# Patient Record
Sex: Female | Born: 1940 | Race: White | Hispanic: No
Health system: Southern US, Community
[De-identification: ages and names within clinical notes are randomized; demographics above are authoritative.]

---

## 2005-06-05 ENCOUNTER — Emergency Department: Payer: Self-pay | Admitting: Emergency Medicine

## 2005-06-05 ENCOUNTER — Other Ambulatory Visit: Payer: Self-pay

## 2007-03-11 ENCOUNTER — Ambulatory Visit: Payer: Self-pay | Admitting: Family Medicine

## 2007-04-09 IMAGING — CT CT CHEST W/ CM
2 series · 16 of 31 positions shown, 19 images · IV contrast (APPLIED)
Comparison: none

REASON FOR EXAM: Chest pain
COMMENTS:

[Series 4: soft tissue · axial · 0.62mm/px · z∈[-318,-152]mm · 7 of 87 slices shown]
[im 11/87  mediastinal]
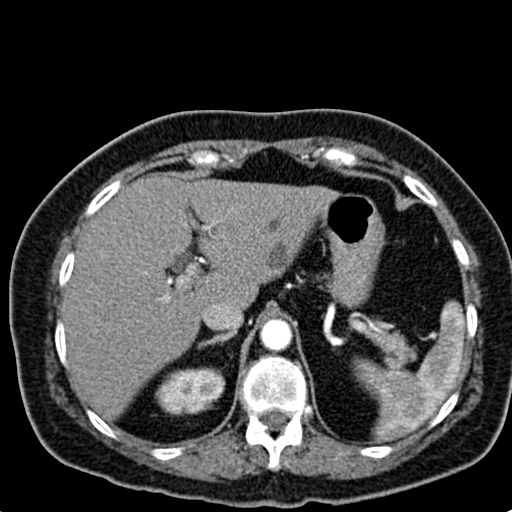
[im 21/87  mediastinal]
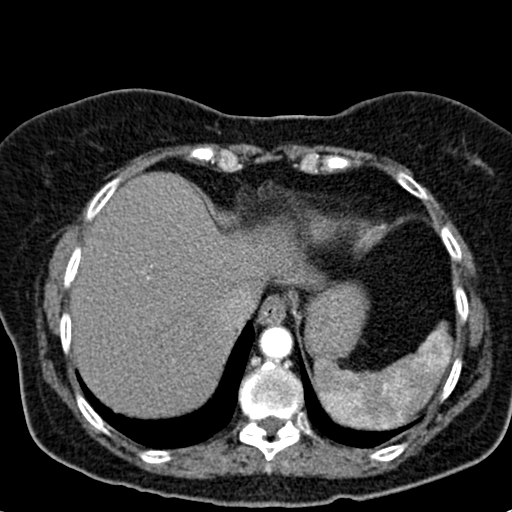
[im 29/87  mediastinal]
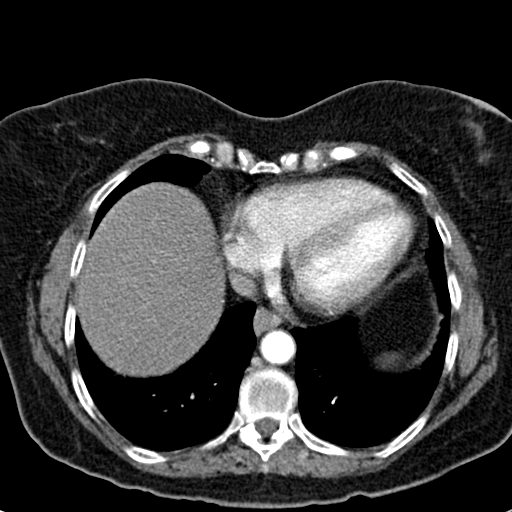
[im 36/87  mediastinal]
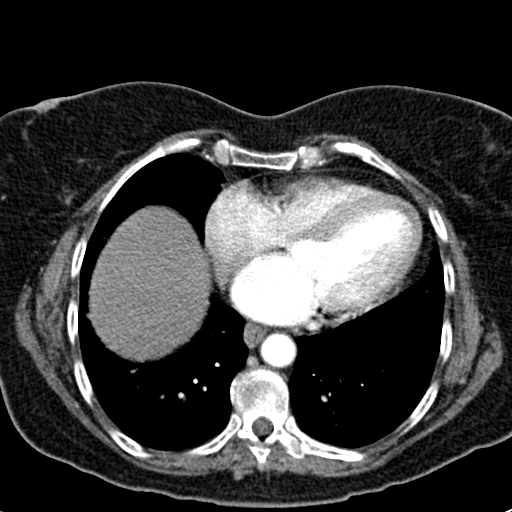
[im 51/87  mediastinal]
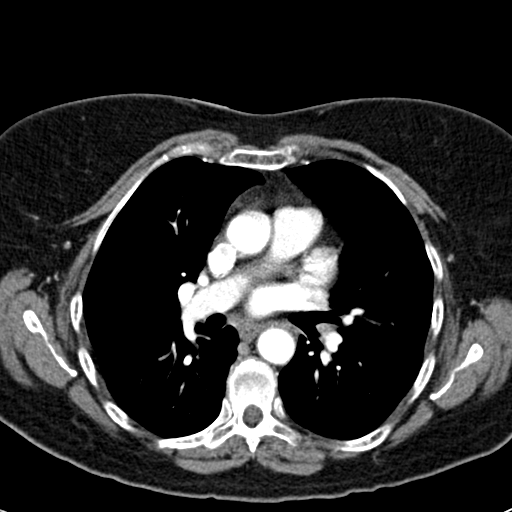
[im 58/87  mediastinal]
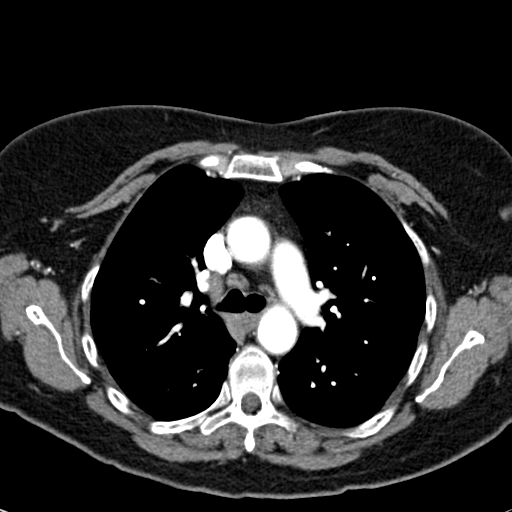
[im 66/87  mediastinal]
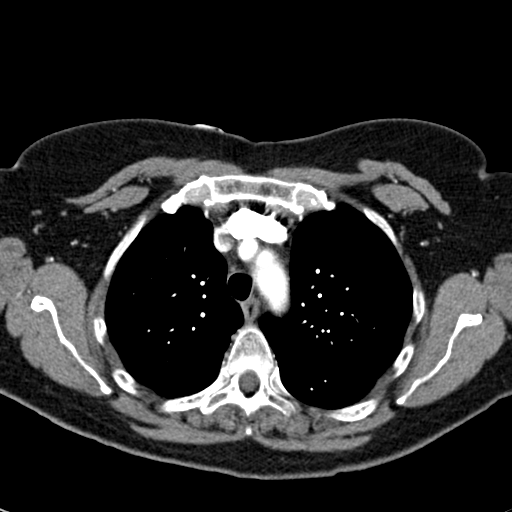

[Series 5: lung windows · axial · 0.62mm/px · z∈[-320,-120]mm · 9 of 52 slices shown, 12 images]
[im 6/52  mediastinal]
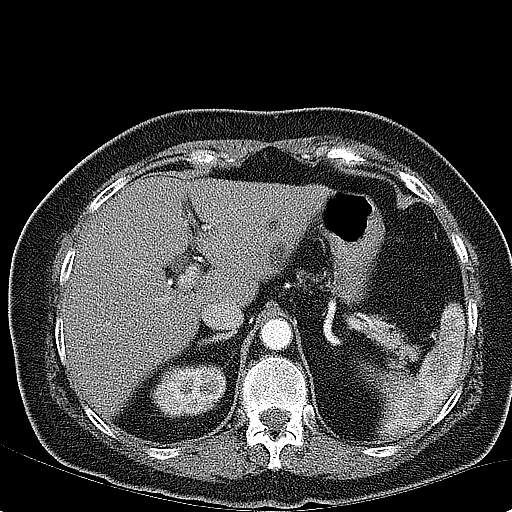
[im 6/52  lung]
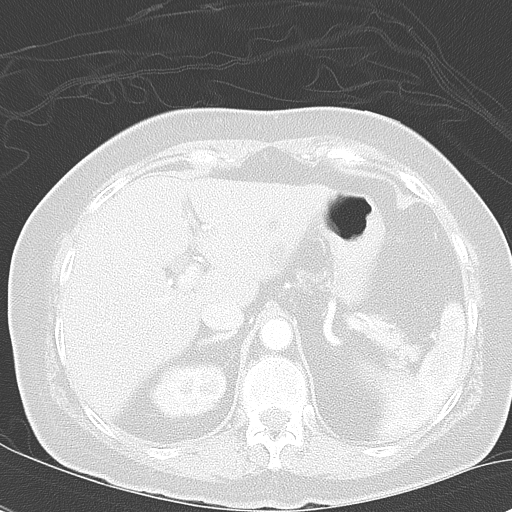
[im 12/52  lung]
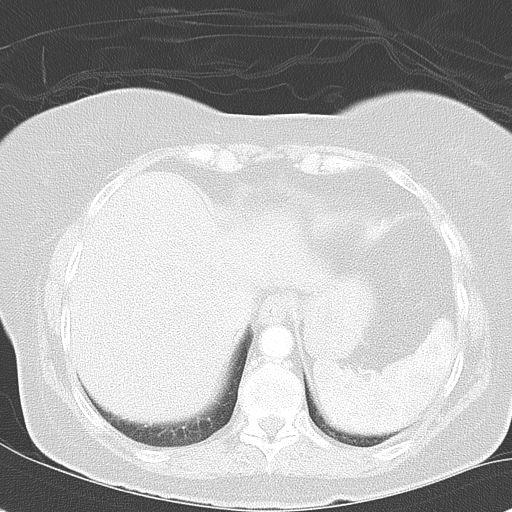
[im 18/52  lung]
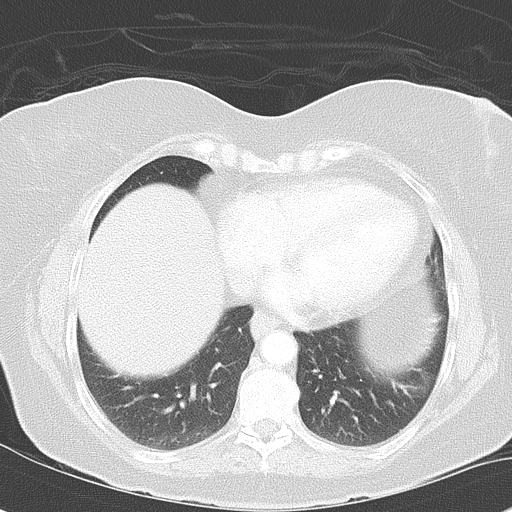
[im 23/52  lung]
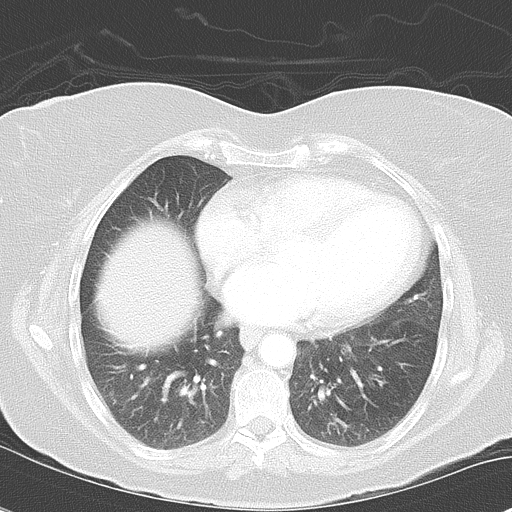
[im 25/52  mediastinal]
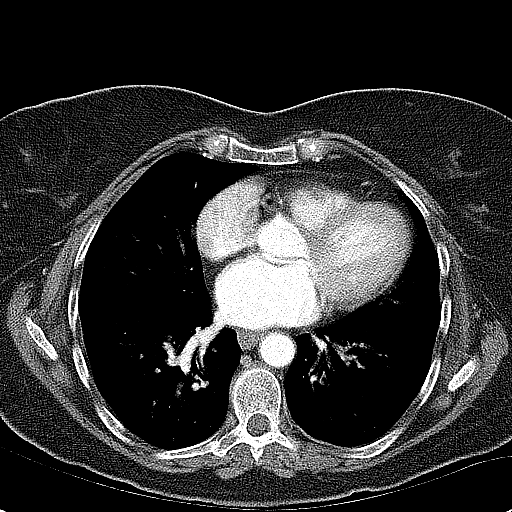
[im 25/52  lung]
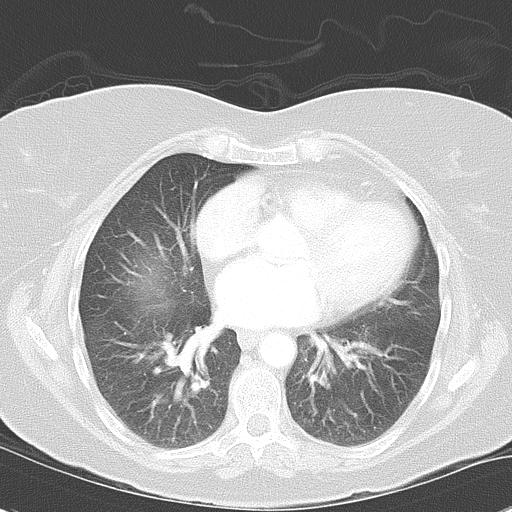
[im 29/52  lung]
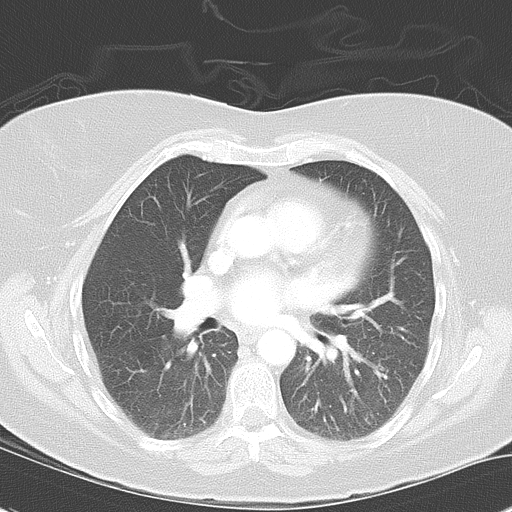
[im 35/52  lung]
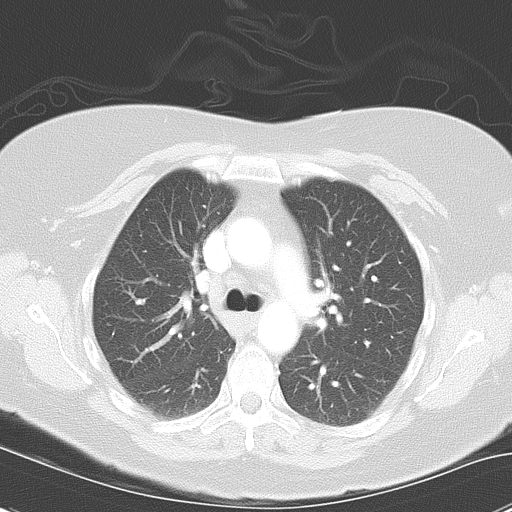
[im 40/52  lung]
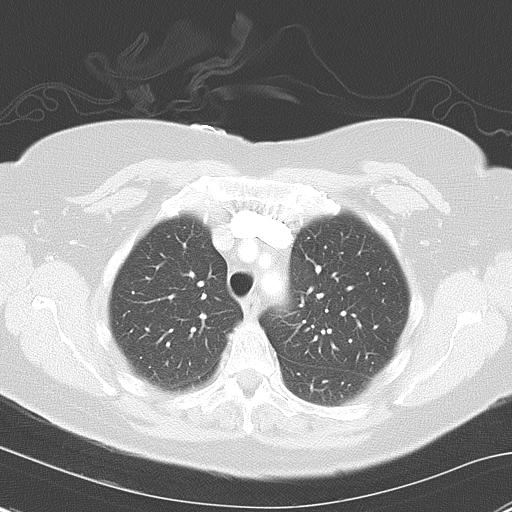
[im 46/52  mediastinal]
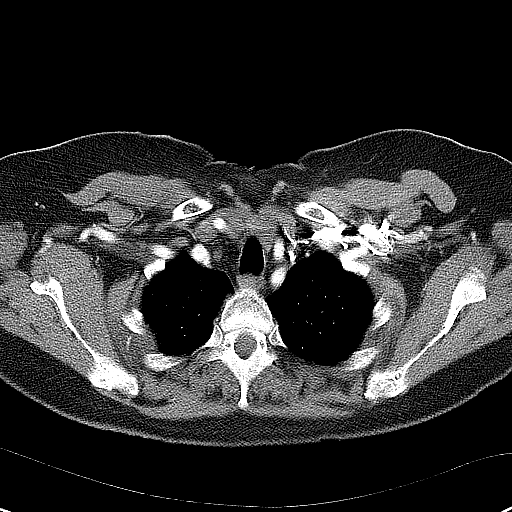
[im 46/52  lung]
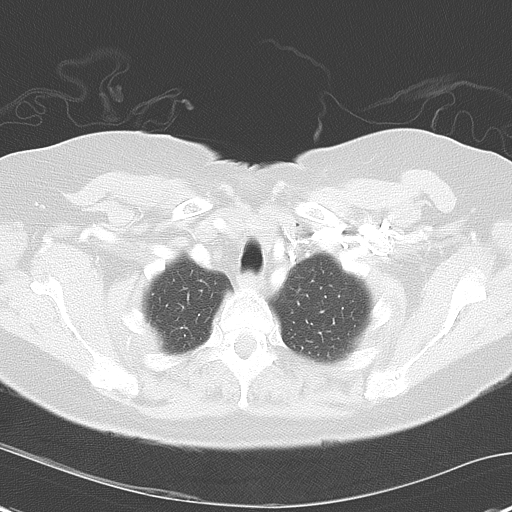

[16 of 31 positions shown; findings below may reference images not displayed]

PROCEDURE:     CT  - CT CHEST (FOR PE) W  - June 05, 2005  [DATE]

RESULT:          Spiral 3 mm sections were obtained from the thoracic inlet
through the lung bases status post intravenous administration of 75 ml of
Fsovue-VAN.

Evaluation of the mediastinum and hilar regions and structures demonstrates
no evidence of mediastinal nor hilar adenopathy nor masses.

There is no evidence of filling defects within the main lobar segmental
pulmonary arteries to suggest sequela of pulmonary embolus.

Evaluation of the lung parenchyma demonstrates no evidence of focal
infiltrates, effusions or edema.

The visualized upper abdominal viscera demonstrate no gross abnormalities.
The patient is status post cholecystectomy.

Incidental note is made of a low attenuating area projecting within the LEFT
lobe of the liver.  This may represent a liver cyst, though if there is a
history of neoplastic disease or abnormal liver function evaluation, this
area can be further evaluated with a triphasic CT or liver MRI for more
ominous etiologies.
IMPRESSION: 1.     No evidence of a pulmonary embolus.
2.     Dr. Mihkel-Matteus, of the emergency department, was informed of the
pulmonary embolus findings at the time of the initial interpretation.
3.     An area of low attenuation within the LEFT lobe of the liver.  This
area may represent sequela of a cyst.  This can be monitored with triphasic
CT and/or further evaluated with MRI.  Correlation with liver function tests
is recommended if clinically warranted.

## 2008-03-11 ENCOUNTER — Ambulatory Visit: Payer: Self-pay | Admitting: Family Medicine

## 2008-03-21 ENCOUNTER — Emergency Department: Payer: Self-pay | Admitting: Emergency Medicine

## 2008-12-16 ENCOUNTER — Ambulatory Visit: Payer: Self-pay | Admitting: Internal Medicine

## 2009-03-14 ENCOUNTER — Ambulatory Visit: Payer: Self-pay | Admitting: Family Medicine

## 2010-07-04 ENCOUNTER — Ambulatory Visit: Payer: Self-pay | Admitting: Family Medicine

## 2010-10-20 IMAGING — CR DG ABDOMEN 2V
1 series · 4 of 4 positions shown · non-contrast
Comparison: none

REASON FOR EXAM: abdominal pain
COMMENTS:

[Series 1: view not recorded · 0.17mm/px · 4 of 4 slices shown]
[im 1/4]
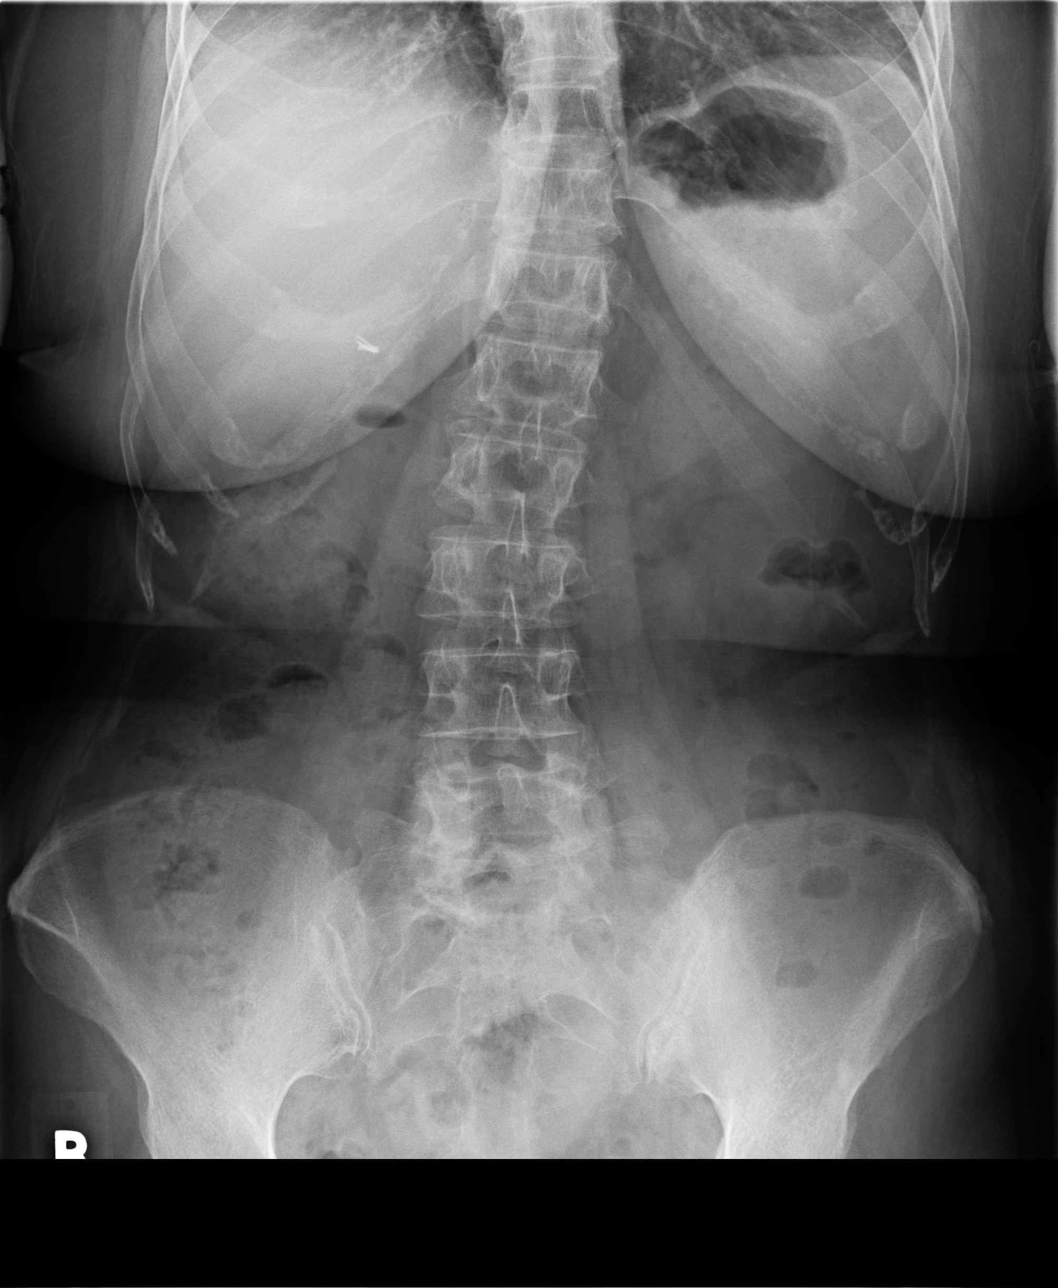
[im 2/4]
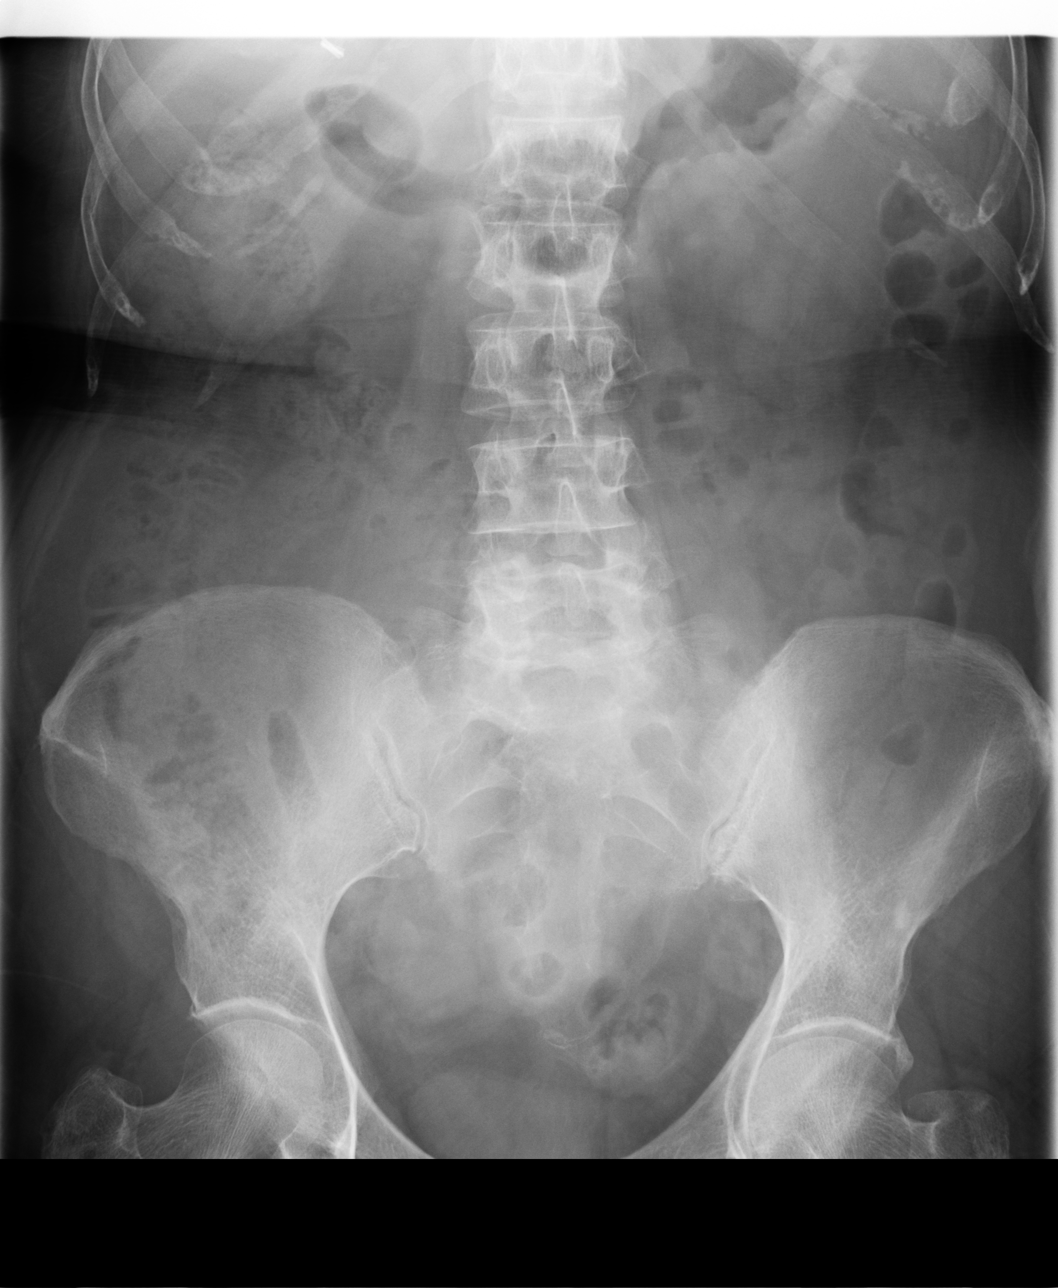
[im 3/4]
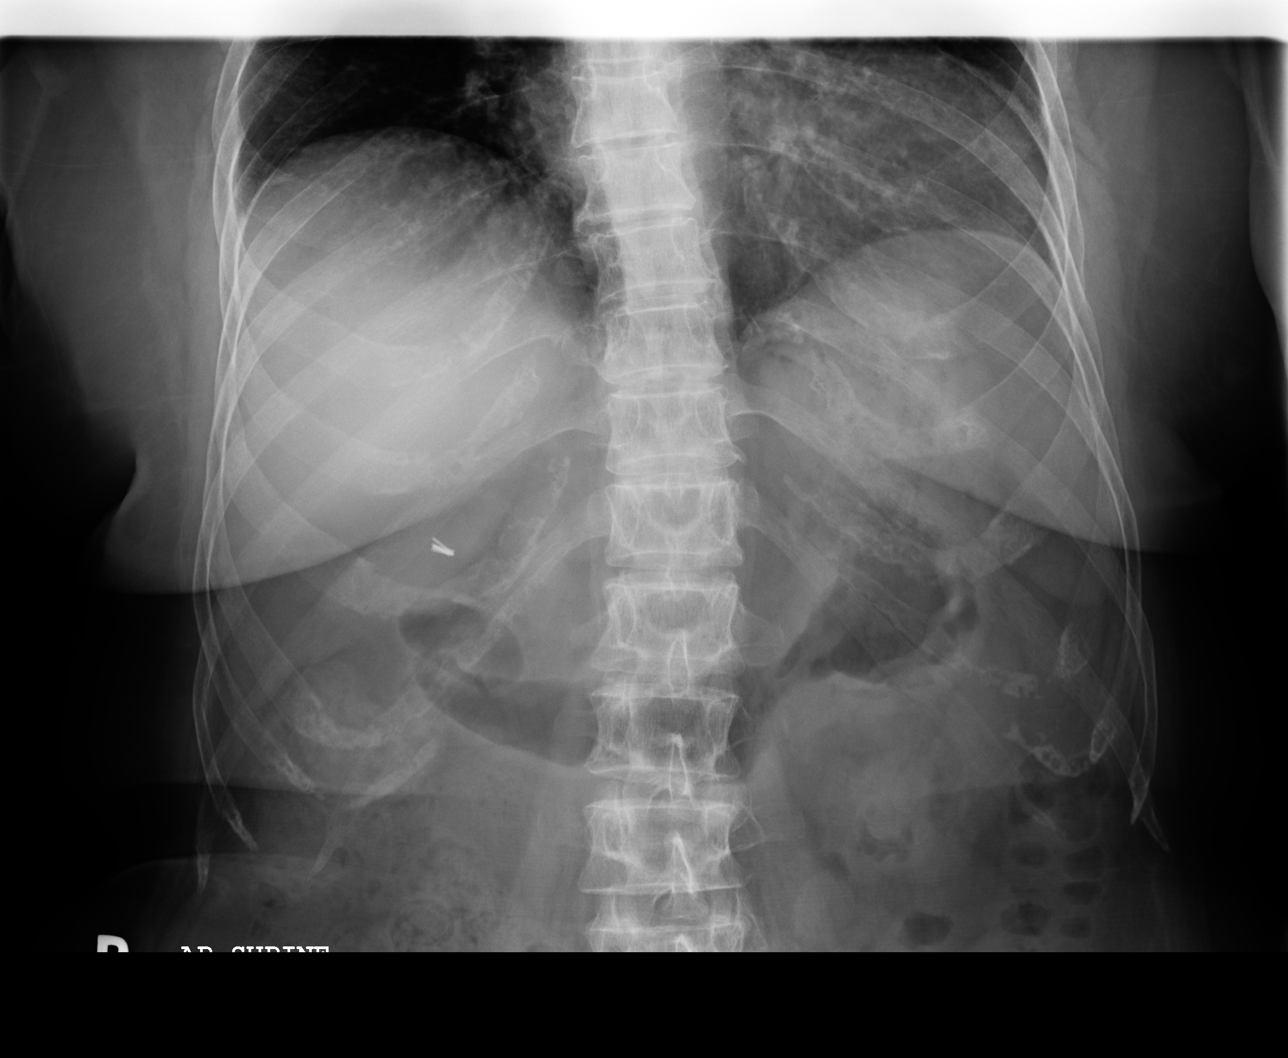
[im 4/4]
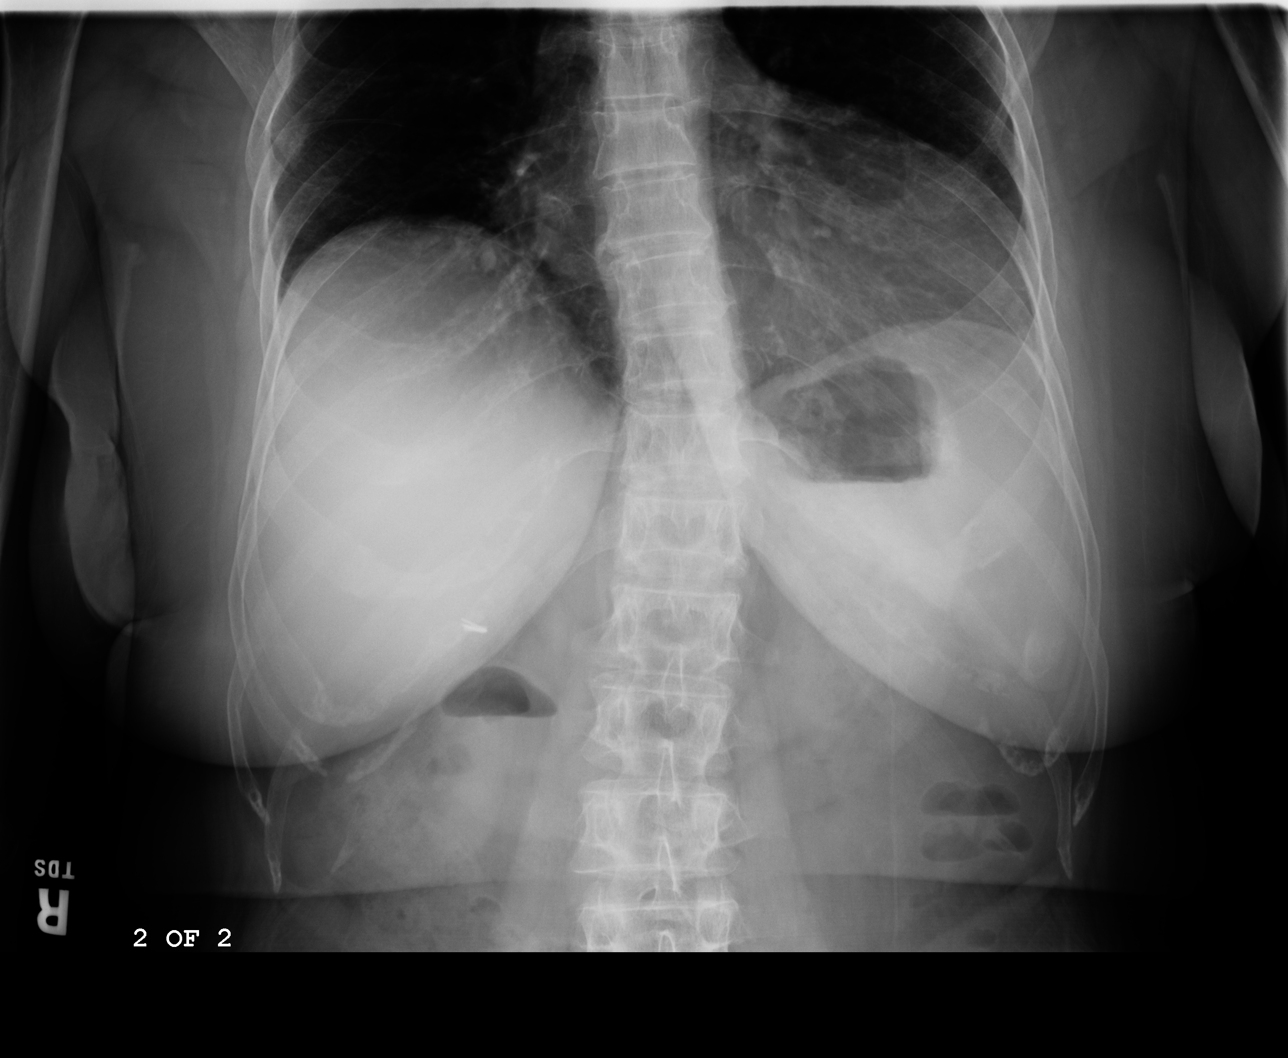

[4 of 4 positions shown; findings below may reference images not displayed]

PROCEDURE:     MDR - MDR ABDOMEN 2V FLAT AND ERECT  - December 16, 2008  [DATE]

RESULT:     Air is seen within nondilated loops of large and small bowel. A
moderate to large amount of stool is appreciated within the colon. The
visualized bony skeleton demonstrates no evidence of fracture or
dislocation. There is mild S-shaped scoliosis of the thoracolumbar spine.
IMPRESSION: Nonobstructive bowel gas pattern with a moderate to large amount of stool.

## 2010-12-13 ENCOUNTER — Ambulatory Visit: Payer: Self-pay | Admitting: Family Medicine

## 2011-07-18 ENCOUNTER — Ambulatory Visit: Payer: Self-pay | Admitting: Family Medicine

## 2011-08-08 ENCOUNTER — Ambulatory Visit: Payer: Self-pay | Admitting: Ophthalmology

## 2011-08-08 LAB — POTASSIUM: Potassium: 4.1 mmol/L (ref 3.5–5.1)

## 2011-08-27 ENCOUNTER — Ambulatory Visit: Payer: Self-pay | Admitting: Ophthalmology

## 2011-09-17 ENCOUNTER — Ambulatory Visit: Payer: Self-pay | Admitting: Ophthalmology

## 2012-07-18 ENCOUNTER — Ambulatory Visit: Payer: Self-pay | Admitting: Family Medicine

## 2014-07-18 NOTE — Op Note (Signed)
PATIENT NAME:  Jenna [name], Jenna [name] MR#:  [id number] DATE OF BIRTH:  1940-08-05  DATE OF PROCEDURE:  09/17/2011  PREOPERATIVE DIAGNOSIS: Cataract, right eye.   POSTOPERATIVE DIAGNOSIS: Cataract, right eye.   PROCEDURE PERFORMED: Extracapsular cataract extraction using phacoemulsification with placement of an Alcon SN6CWS, 17.5-diopter posterior chamber lens, serial number [id number].   SURGEON: Maylon Peppers. Mliss Wedin, MD   ANESTHESIA: 4% lidocaine and 0.75% Marcaine in a 50-50 mixture with 10 units/mL of Hylenex added, given as a peribulbar.   ANESTHESIOLOGIST: Linward Natal, MD   COMPLICATIONS: Iris prolapse during the procedure and positive pressure.   ESTIMATED BLOOD LOSS: Less than 1 mL.   DESCRIPTION OF PROCEDURE: The patient was brought to the Operating Room and given IV sedation and peribulbar block. She was then prepped and draped in the usual fashion. The vertical rectus muscles were imbricated using 5-0 silk sutures, bridle sutures. A limbal peritomy was carried out for one clock hour at 1:00, and hemostasis was obtained with cautery. A partial thickness scleral groove was made at the posterior surgical limbus and dissected anteriorly into clear cornea with an Alcon crescent knife. The anterior chamber was entered superonasally through clear cornea with a paracentesis knife and through the lamellar dissection with a 2.6 mm keratome. DisCoVisc was used to replace the aqueous, and a continuous tear circular capsulorrhexis was carried out. Phacoemulsification was carried out in a divide and conquer technique. Ultrasound time was 1 minute and 49.8 seconds with an average power of 21.7%, CDE of 39.77. Irrigation and aspiration was carried out, and during the irrigation-aspiration it was noticed there was some positive pressure, especially while trying to get cortex at the superior aspect of the cataract. There was also a mild degree of iris prolapse. The anterior chamber was deepened using DisCoVisc,  and the bag was inflated with DisCoVisc. The intraocular lens was inserted in the capsular bag with some mild difficulty due to the positive pressure which did get somewhat worse during this time. The chamber was deepened again with balanced salt, and the wound was inflated with balanced salt. The chamber continued to shallow with some iris prolapse. DisCoVisc was used to hold the superior iris back, and four 10-0 nylon sutures were placed across the wound. There was also a suture placed across the paracentesis. Eventually the chamber deepened sufficiently, and Miostat was injected through the paracentesis tract. A Sinskey hook was used prior to that to check to make sure that the lens was in the bag. I was able to lift the edge of the capsule in front of the bag to confirm this. The wound was checked for leaks. The eye was checked with digital pressure and was slightly firm. Decision was made to give the patient 500 mg of Diamox IV and a drop of timolol to hold the pressure down. The wound was checked for leaks, and the paracentesis location was checked for leaks and none were found. The bridle sutures were removed. The conjunctiva was closed with cautery, and three drops of Vigamox were placed on the eye as well as the timolol. A shield was placed on the eye, and the patient was discharged to the recovery area in good condition. There was no evidence of a suprachoroidal hemorrhage noted during the case. It is possible that the tendency for positive pressure had to do with some excessive leakage of fluid at the wound.   ____________________________ Maylon Peppers Ade Stmarie, MD sad:cbb D: 09/17/2011 14:33:31 ET T: 09/17/2011 15:25:45 ET JOB#: [id number]  cc: Viviann Spare  Mellody MemosA. Wenceslaus Gist, MD, <Dictator> Erline LevineSTEVEN A Taite Schoeppner MD ELECTRONICALLY SIGNED 10/03/2011 11:57

## 2014-07-18 NOTE — Op Note (Signed)
PATIENT NAME:  Jenna [name], Jenna [name] MR#:  [id number] DATE OF BIRTH:  Nov 13, 1940  DATE OF PROCEDURE:  08/27/2011  PREOPERATIVE DIAGNOSIS:  Cataract, left eye.    POSTOPERATIVE DIAGNOSIS:  Cataract, left eye.  PROCEDURE PERFORMED:  Extracapsular cataract extraction using phacoemulsification with placement of an Alcon SN6CWS, 18.5-diopter posterior chamber lens, serial [id number].  SURGEON:  Maylon PeppersSteven [name]. Ariday Brinker, MD  ASSISTANT:  None.  ANESTHESIA:  4% lidocaine and 0.75% Marcaine in [name] 50/50 mixture with 10 units/mL of Hylenex added, given as [name] peribulbar.   ANESTHESIOLOGIST: Dr. Dimple Caseyice.    COMPLICATIONS:  None.  ESTIMATED BLOOD LOSS:  Less than 1 ml.  DESCRIPTION OF PROCEDURE:  The patient was brought to the operating room and given [name] peribulbar block.  The patient was then prepped and draped in the usual fashion.  The vertical rectus muscles were imbricated using 5-0 silk sutures.  These sutures were then clamped to the sterile drapes as bridle sutures.  [name] limbal peritomy was performed extending two clock hours and hemostasis was obtained with cautery.  [name] partial thickness scleral groove was made at the surgical limbus and dissected anteriorly in [name] lamellar dissection using an Alcon crescent knife.  The anterior chamber was entered supero-temporally with [name] Superblade and through the lamellar dissection with [name] 2.6 mm keratome.  DisCoVisc was used to replace the aqueous and [name] continuous tear capsulorrhexis was carried out.  Hydrodissection and hydrodelineation were carried out with balanced salt and [name] 27 gauge canula.  The nucleus was rotated to confirm the effectiveness of the hydrodissection.  Phacoemulsification was carried out using [name] divide-and-conquer technique.  Total ultrasound time was one minute and 34 seconds with an average power of 16 percent. CDE of 29.34.   Irrigation/aspiration was used to remove the residual cortex.  DisCoVisc was used to inflate the capsule and the internal  incision was enlarged to 3 mm with the crescent knife.  The intraocular lens was folded and inserted into the capsular bag using the AcrySert delivery system.    Irrigation/aspiration was used to remove the residual DisCoVisc.  Miostat was injected into the anterior chamber through the paracentesis track to inflate the anterior chamber and induce miosis.  The wound was checked for leaks and none were found. The conjunctiva was closed with cautery and the bridle sutures were removed.  Two drops of 0.3% Vigamox were placed on the eye.   An eye shield was placed on the eye.  The patient was discharged to the recovery room in good condition.   ____________________________ Maylon PeppersSteven [name]. Adelfo Diebel, MD sad:ap D: 08/27/2011 12:58:11 ET T: 08/27/2011 13:19:40 ET JOB#: [id number]  cc: Viviann SpareSteven [name]. Finnlee Guarnieri, MD, <Dictator> Erline LevineSTEVEN [name] Jusitn Salsgiver MD ELECTRONICALLY SIGNED 09/03/2011 13:32
